# Patient Record
Sex: Female | Born: 1975 | Race: White | Hispanic: No | Marital: Married
Health system: Southern US, Community
[De-identification: ages and names within clinical notes are randomized; demographics above are authoritative.]

---

## 2006-03-28 ENCOUNTER — Ambulatory Visit: Payer: Self-pay

## 2009-05-25 ENCOUNTER — Ambulatory Visit: Payer: Self-pay

## 2012-05-01 ENCOUNTER — Ambulatory Visit: Payer: Self-pay | Admitting: Family Medicine

## 2013-05-15 ENCOUNTER — Ambulatory Visit: Payer: Self-pay | Admitting: Gastroenterology

## 2013-06-11 ENCOUNTER — Ambulatory Visit: Payer: Self-pay | Admitting: Gastroenterology

## 2013-06-17 ENCOUNTER — Ambulatory Visit: Payer: Self-pay | Admitting: Surgery

## 2013-06-18 LAB — PATHOLOGY REPORT

## 2014-02-13 ENCOUNTER — Ambulatory Visit: Payer: Self-pay | Admitting: Gastroenterology

## 2014-02-17 LAB — PATHOLOGY REPORT

## 2014-09-11 IMAGING — NM NUCLEAR MEDICINE HEPATOHBILIARY INCLUDE GB
3 series · 19 of 19 positions shown · non-contrast
Comparison: none

REASON FOR EXAM: Abd pain RUQ LUQ epigastric pain GERD Esophageal spasm
COMMENTS:

[Series 1000: gallbladder ef · 4.80mm/px · 6 of 120 frames shown]
[frame 11/120]
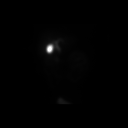
[frame 31/120]
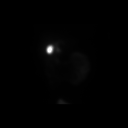
[frame 51/120]
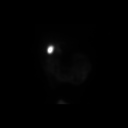
[frame 71/120]
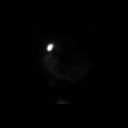
[frame 91/120]
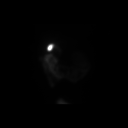
[frame 111/120]
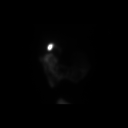

[Series 1000: gallbladder statics · 4.80mm/px · 7 of 7 slices shown]
[im 1/7]
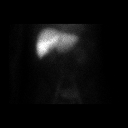
[im 2/7]
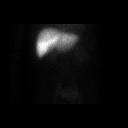
[im 3/7]
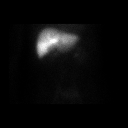
[im 4/7]
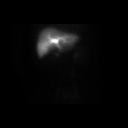
[im 5/7]
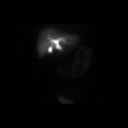
[im 6/7]
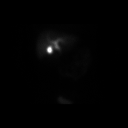
[im 7/7]
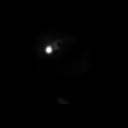

[Series 1000: gallbladder ef (results) · 4.80mm/px · 6 of 120 frames shown]
[frame 11/120]
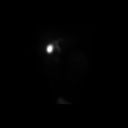
[frame 31/120]
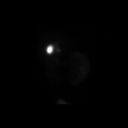
[frame 51/120]
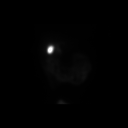
[frame 71/120]
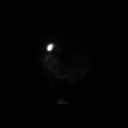
[frame 91/120]
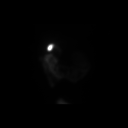
[frame 111/120]
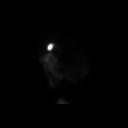

[19 of 19 positions shown; findings below may reference images not displayed]

PROCEDURE:     NM  - NM HEPATO WITH GB EJECT FRACTION  - May 15, 2013  [DATE]

RESULT:     The patient received 8.05 mCi of technetium 99m labeled Choletec
for this study. The patient also received 8 ounces of Ensure orally for the
ejection fraction portion of the study.

There is adequate uptake of the radiopharmaceutical by the liver. The common
bile duct and intrahepatic ducts are visible by 15 minutes and the
gallbladder is visible by 20 minutes. Bowel activity is demonstrated by 20
minutes. The 30 minute gallbladder ejection fraction is low however at 29%.
IMPRESSION: 1. There is no evidence of cystic duct obstruction or common bile duct
obstruction.
2. There is an abnormally low ejection fraction consistent with gallbladder
dysfunction.

[REDACTED]

## 2014-11-16 IMAGING — US ABDOMEN ULTRASOUND
1 series · 14 of 25 positions shown · non-contrast
Comparison: none

REASON FOR EXAM: Abd pain RUQ LUQ epigastric pain GERD Esophageal spasm
COMMENTS:

[Series 1: abdomen ultrasound · 0.26mm/px · 14 of 105 slices shown]
[im 1/105]
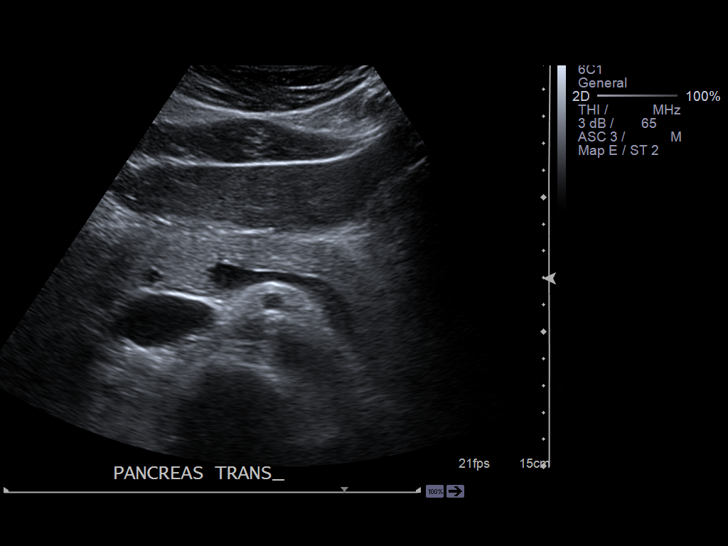
[im 9/105]
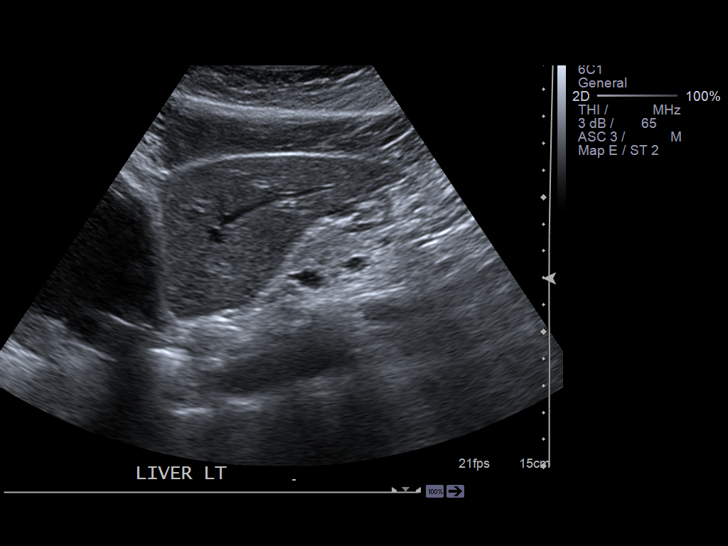
[im 18/105]
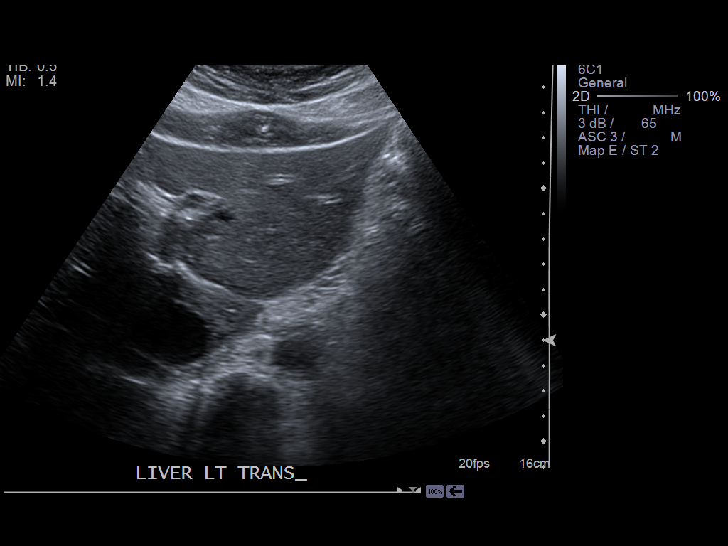
[im 27/105]
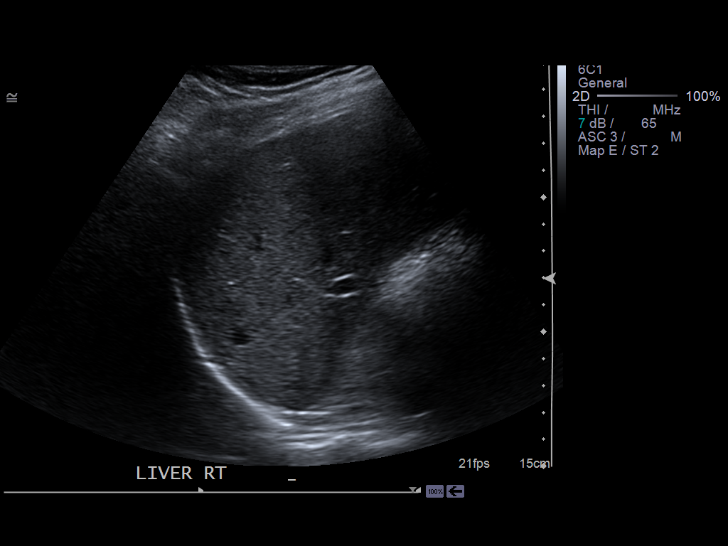
[im 35/105]
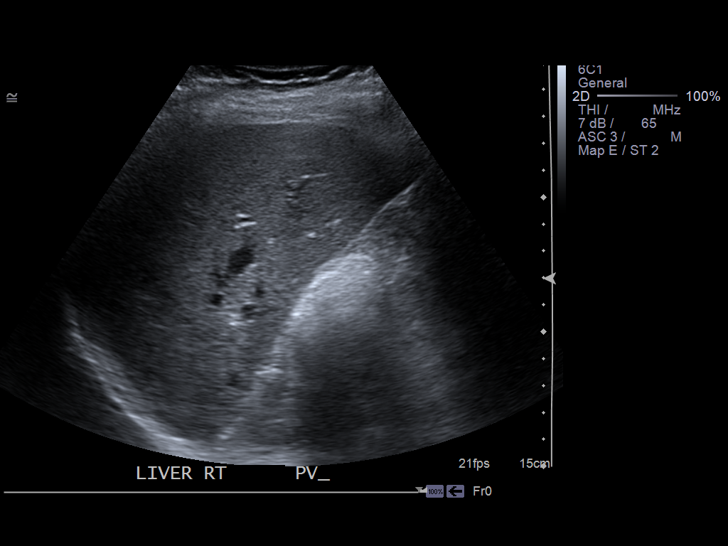
[im 40/105]
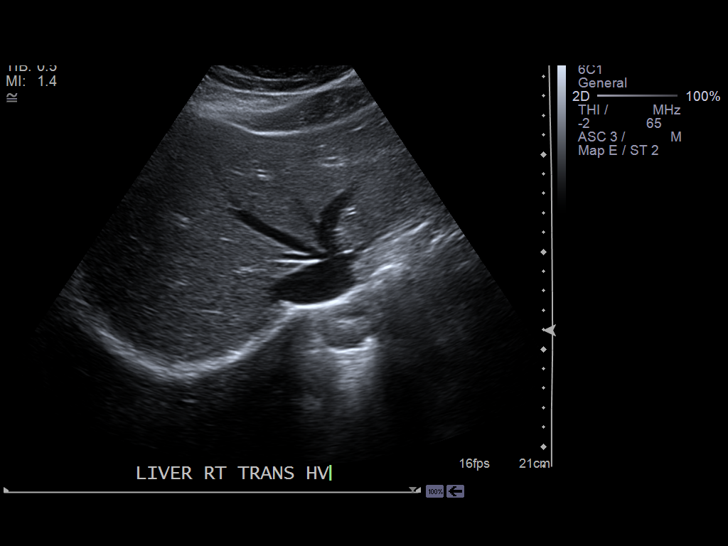
[im 48/105]
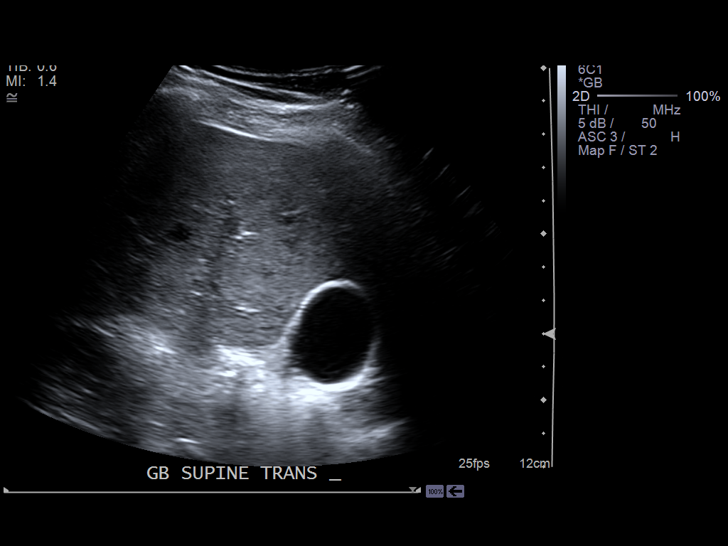
[im 57/105]
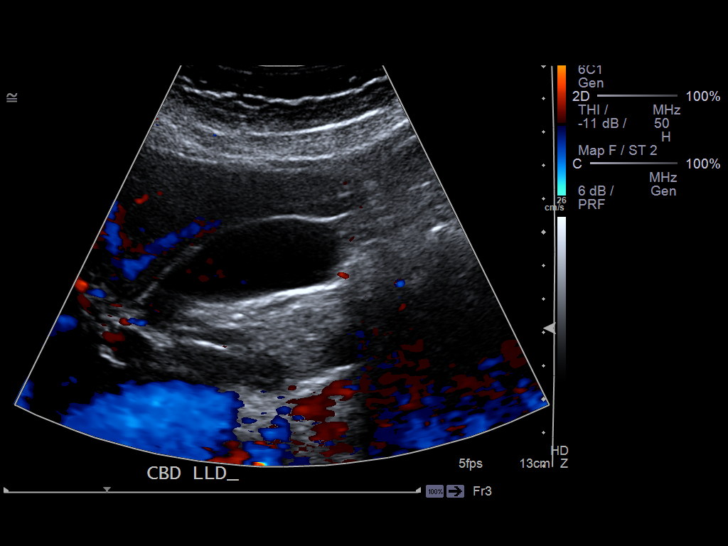
[im 66/105]
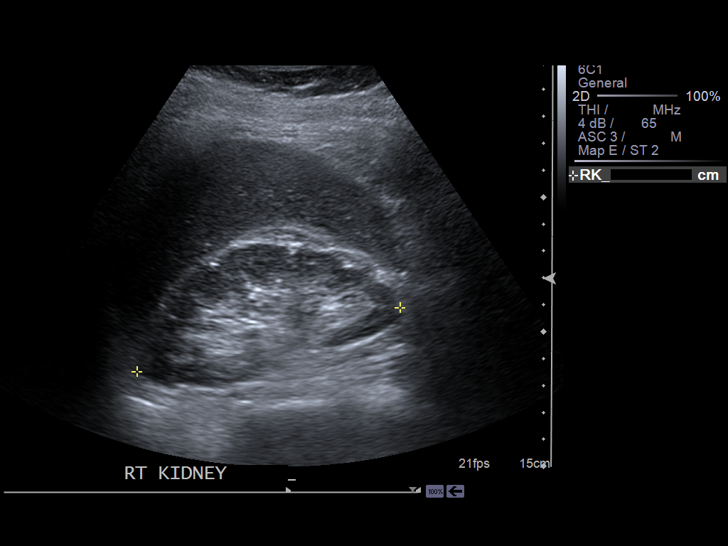
[im 70/105]
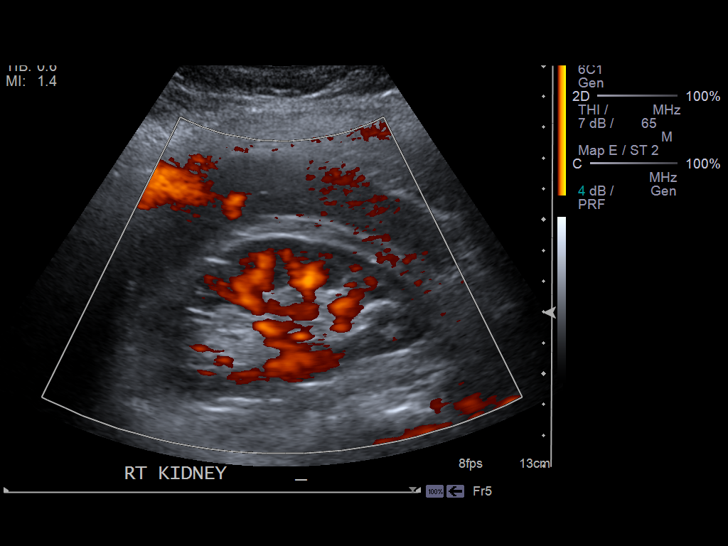
[im 79/105]
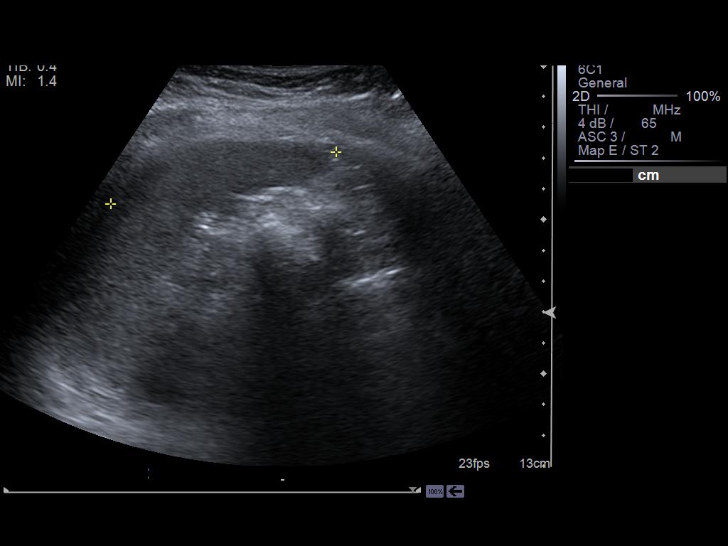
[im 87/105]
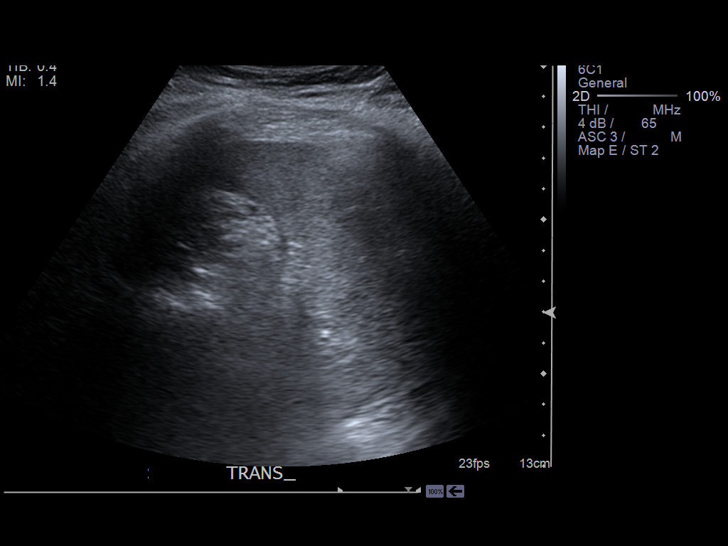
[im 96/105]
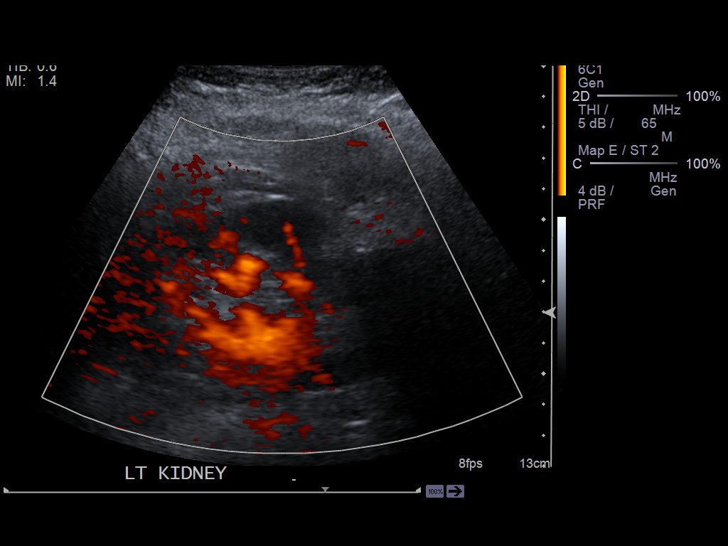
[im 105/105]
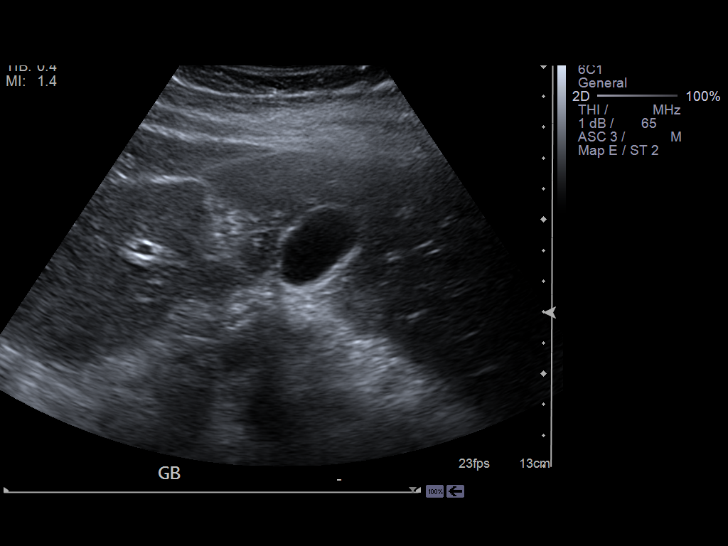

[14 of 25 positions shown; findings below may reference images not displayed]

PROCEDURE:     US  - US ABDOMEN GENERAL SURVEY  - May 15, 2013  [DATE]

RESULT:     The liver exhibits normal echotexture with no focal mass nor
ductal dilation. Portal venous flow is normal in direction toward the liver.
Knee pancreas, gallbladder, spleen, and kidneys are normal in appearance.
There is no positive sonographic Murphy's sign. The common bile duct
measures 3 mm in diameter. Abdominal aorta and inferior vena cava are normal
in appearance. There is no evidence of ascites.
IMPRESSION: Normal abdominal ultrasound examination.

[REDACTED]

## 2014-12-18 NOTE — Op Note (Signed)
PATIENT NAME:  Robin RiffleMARTIN, [name] MR#:  [id number] DATE OF BIRTH:  04-25-1976  DATE OF PROCEDURE:  06/17/2013  PREOPERATIVE DIAGNOSIS: Chronic cholecystitis.   POSTOPERATIVE DIAGNOSIS: Chronic cholecystitis.   PROCEDURE: Laparoscopic cholecystectomy.   SURGEON: Renda RollsWilton Hyden Soley, M.D.   ANESTHESIA: General.   INDICATIONS: This 39 year old female has a several year history of intermittent right upper quadrant pain with fatty food intolerance. She had an abnormally low gallbladder ejection fraction at 29%. Diagnosis was made of chronic cholecystitis and surgery was recommended for definitive treatment.   DESCRIPTION OF PROCEDURE: The patient was placed on the operating table in the supine position under general endotracheal anesthesia. The abdomen was prepared with ChloraPrep and draped in a sterile manner. A short incision was made in the inferior aspect of the umbilicus and carried down to the deep fascia, which was grasped with laryngeal hook and elevated. A Veress needle was inserted, aspirated, and irrigated with a saline solution. Next, the peritoneal cavity was inflated with carbon dioxide. The Veress needle was removed. The 10 mm cannula was inserted. The 10 mm, 0 degree laparoscope was inserted to view the peritoneal cavity. I did note that there was a small amount of free, clear, collarless fluid in the pelvis. There was some enlargement of her uterus and also a possible right ovarian cyst. The liver appeared normal. Another incision was made in the epigastrium just slightly to the right of the midline to introduce an 11 mm cannula. Two incisions were made in the lateral aspect of the right upper quadrant to introduce two 5 mm cannulas. The gallbladder was retracted towards the right shoulder. Multiple adhesions were taken down with blunt and sharp dissection. A small amount of blood was aspirated. Several small bleeding points were cauterized. The porta hepatis was demonstrated. I identified the  common hepatic artery and the common bile duct. The gallbladder neck was mobilized with incision of the visceral peritoneum. The cystic duct was dissected free from surrounding structures and it appeared to be small in size. The cystic artery was dissected free from surrounding structures. A critical view of safety was demonstrated. An Endo Clip was placed across the cystic duct adjacent to the neck of the gallbladder.   An incision was made in the cystic duct to introduce a Reddick catheter. It would only thread in just about 7 mm. It would not thread in so that the balloon was completely inside the duct. The balloon was inflated; however, it came out multiple times and I elected not to do a cholangiogram. The cystic duct was doubly ligated with endoclips and divided. The cystic artery was controlled with double endoclips and divided. The gallbladder was dissected free from the liver with blunt dissection and also the use of hook and cautery. There was a small artery coursing across the anterior aspect of the gallbladder about the midportion of the gallbladder, which was controlled with double endoclips and divided. The gallbladder was further dissected free from the liver with use of hook and cautery. The site was irrigated with heparinized saline solution and aspirated. Hemostasis was intact. The gallbladder was delivered up through the infraumbilical port, opened and suctioned. 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 The gallbladder was submitted in formalin for routine pathology. The right upper quadrant was further inspected. Hemostasis was intact. The cannulas were removed. Carbon dioxide was allowed to escape from the peritoneal cavity. Skin incisions were closed with interrupted 5-0 chromic subcuticular suture, benzoin, and Steri-Strips. Dressings were applied with paper tape. The  patient tolerated surgery satisfactorily and was then prepared for transfer to the recovery room.    ____________________________ Shela Commons. Renda Rolls, MD [email or web address] D: 06/17/2013 14:22:51 ET T: 06/17/2013 14:37:08 ET JOB#: [id number]  cc: Adella Hare, MD, <Dictator> Adella Hare MD ELECTRONICALLY SIGNED 06/19/2013 22:01

## 2018-05-03 DIAGNOSIS — Z833 Family history of diabetes mellitus: Secondary | ICD-10-CM | POA: Diagnosis not present

## 2018-05-03 DIAGNOSIS — Z01419 Encounter for gynecological examination (general) (routine) without abnormal findings: Secondary | ICD-10-CM | POA: Diagnosis not present

## 2018-05-03 DIAGNOSIS — Z1239 Encounter for other screening for malignant neoplasm of breast: Secondary | ICD-10-CM | POA: Diagnosis not present

## 2018-05-03 DIAGNOSIS — Z6832 Body mass index (BMI) 32.0-32.9, adult: Secondary | ICD-10-CM | POA: Diagnosis not present

## 2018-05-03 DIAGNOSIS — K220 Achalasia of cardia: Secondary | ICD-10-CM | POA: Diagnosis not present

## 2018-05-03 DIAGNOSIS — N6320 Unspecified lump in the left breast, unspecified quadrant: Secondary | ICD-10-CM | POA: Diagnosis not present

## 2018-05-03 DIAGNOSIS — Z124 Encounter for screening for malignant neoplasm of cervix: Secondary | ICD-10-CM | POA: Diagnosis not present

## 2018-05-23 DIAGNOSIS — Z880 Allergy status to penicillin: Secondary | ICD-10-CM | POA: Diagnosis not present

## 2018-05-23 DIAGNOSIS — Z9889 Other specified postprocedural states: Secondary | ICD-10-CM | POA: Diagnosis not present

## 2018-05-23 DIAGNOSIS — K209 Esophagitis, unspecified: Secondary | ICD-10-CM | POA: Diagnosis not present

## 2018-05-23 DIAGNOSIS — K228 Other specified diseases of esophagus: Secondary | ICD-10-CM | POA: Diagnosis not present

## 2018-05-23 DIAGNOSIS — K3189 Other diseases of stomach and duodenum: Secondary | ICD-10-CM | POA: Diagnosis not present

## 2018-05-23 DIAGNOSIS — K220 Achalasia of cardia: Secondary | ICD-10-CM | POA: Diagnosis not present

## 2018-06-17 DIAGNOSIS — Z1239 Encounter for other screening for malignant neoplasm of breast: Secondary | ICD-10-CM | POA: Diagnosis not present

## 2018-06-17 DIAGNOSIS — Z1231 Encounter for screening mammogram for malignant neoplasm of breast: Secondary | ICD-10-CM | POA: Diagnosis not present

## 2018-06-17 DIAGNOSIS — N6320 Unspecified lump in the left breast, unspecified quadrant: Secondary | ICD-10-CM | POA: Diagnosis not present

## 2018-07-05 DIAGNOSIS — R922 Inconclusive mammogram: Secondary | ICD-10-CM | POA: Diagnosis not present

## 2018-07-05 DIAGNOSIS — R928 Other abnormal and inconclusive findings on diagnostic imaging of breast: Secondary | ICD-10-CM | POA: Diagnosis not present

## 2019-08-01 DIAGNOSIS — Z0000 Encounter for general adult medical examination without abnormal findings: Secondary | ICD-10-CM | POA: Diagnosis not present

## 2019-08-01 DIAGNOSIS — G8929 Other chronic pain: Secondary | ICD-10-CM | POA: Diagnosis not present

## 2019-08-01 DIAGNOSIS — N6320 Unspecified lump in the left breast, unspecified quadrant: Secondary | ICD-10-CM | POA: Diagnosis not present

## 2019-08-01 DIAGNOSIS — Z6832 Body mass index (BMI) 32.0-32.9, adult: Secondary | ICD-10-CM | POA: Diagnosis not present

## 2019-08-01 DIAGNOSIS — Z1231 Encounter for screening mammogram for malignant neoplasm of breast: Secondary | ICD-10-CM | POA: Diagnosis not present

## 2019-08-01 DIAGNOSIS — Z1159 Encounter for screening for other viral diseases: Secondary | ICD-10-CM | POA: Diagnosis not present

## 2019-08-01 DIAGNOSIS — E782 Mixed hyperlipidemia: Secondary | ICD-10-CM | POA: Diagnosis not present

## 2019-08-01 DIAGNOSIS — N649 Disorder of breast, unspecified: Secondary | ICD-10-CM | POA: Diagnosis not present

## 2019-08-01 DIAGNOSIS — M25552 Pain in left hip: Secondary | ICD-10-CM | POA: Diagnosis not present

## 2019-08-27 DIAGNOSIS — M25552 Pain in left hip: Secondary | ICD-10-CM | POA: Diagnosis not present

## 2019-08-27 DIAGNOSIS — M25559 Pain in unspecified hip: Secondary | ICD-10-CM | POA: Diagnosis not present

## 2019-08-27 DIAGNOSIS — M76892 Other specified enthesopathies of left lower limb, excluding foot: Secondary | ICD-10-CM | POA: Diagnosis not present

## 2019-08-27 DIAGNOSIS — G8929 Other chronic pain: Secondary | ICD-10-CM | POA: Diagnosis not present
